# Patient Record
Sex: Male | Born: 1995 | Race: White | Hispanic: Yes | Marital: Single | Smoking: Current some day smoker
Health system: Southern US, Community
[De-identification: ages and names within clinical notes are randomized; demographics above are authoritative.]

## PROBLEM LIST (undated history)

## (undated) HISTORY — PX: EYE SURGERY: SHX253

---

## 2020-05-19 ENCOUNTER — Encounter (HOSPITAL_BASED_OUTPATIENT_CLINIC_OR_DEPARTMENT_OTHER): Payer: Self-pay

## 2020-05-19 ENCOUNTER — Other Ambulatory Visit: Payer: Self-pay

## 2020-05-19 ENCOUNTER — Emergency Department (HOSPITAL_BASED_OUTPATIENT_CLINIC_OR_DEPARTMENT_OTHER)
Admission: EM | Admit: 2020-05-19 | Discharge: 2020-05-19 | Disposition: A | Discharge status: Home / Self Care | Payer: Worker's Compensation | Attending: Emergency Medicine | Admitting: Emergency Medicine

## 2020-05-19 ENCOUNTER — Emergency Department (HOSPITAL_BASED_OUTPATIENT_CLINIC_OR_DEPARTMENT_OTHER): Payer: Worker's Compensation

## 2020-05-19 DIAGNOSIS — S0231XA Fracture of orbital floor, right side, initial encounter for closed fracture: Secondary | ICD-10-CM | POA: Diagnosis not present

## 2020-05-19 DIAGNOSIS — R110 Nausea: Secondary | ICD-10-CM | POA: Insufficient documentation

## 2020-05-19 DIAGNOSIS — R519 Headache, unspecified: Secondary | ICD-10-CM | POA: Insufficient documentation

## 2020-05-19 DIAGNOSIS — S022XXA Fracture of nasal bones, initial encounter for closed fracture: Secondary | ICD-10-CM | POA: Insufficient documentation

## 2020-05-19 DIAGNOSIS — Y990 Civilian activity done for income or pay: Secondary | ICD-10-CM | POA: Diagnosis not present

## 2020-05-19 DIAGNOSIS — R42 Dizziness and giddiness: Secondary | ICD-10-CM | POA: Diagnosis not present

## 2020-05-19 DIAGNOSIS — Z23 Encounter for immunization: Secondary | ICD-10-CM | POA: Diagnosis not present

## 2020-05-19 DIAGNOSIS — W228XXA Striking against or struck by other objects, initial encounter: Secondary | ICD-10-CM | POA: Insufficient documentation

## 2020-05-19 DIAGNOSIS — F17200 Nicotine dependence, unspecified, uncomplicated: Secondary | ICD-10-CM | POA: Diagnosis not present

## 2020-05-19 DIAGNOSIS — S01111A Laceration without foreign body of right eyelid and periocular area, initial encounter: Secondary | ICD-10-CM | POA: Insufficient documentation

## 2020-05-19 DIAGNOSIS — S0181XA Laceration without foreign body of other part of head, initial encounter: Secondary | ICD-10-CM

## 2020-05-19 DIAGNOSIS — S0992XA Unspecified injury of nose, initial encounter: Secondary | ICD-10-CM | POA: Diagnosis present

## 2020-05-19 MED ORDER — LIDOCAINE HCL (PF) 1 % IJ SOLN
5.0000 mL | Freq: Once | INTRAMUSCULAR | Status: AC
  Administered 2020-05-19: 5 mL
  Filled 2020-05-19: qty 5

## 2020-05-19 MED ORDER — AMOXICILLIN-POT CLAVULANATE 875-125 MG PO TABS: 1.0000 | ORAL_TABLET | Freq: Two times a day (BID) | ORAL | 0 refills | Status: AC

## 2020-05-19 MED ORDER — MORPHINE SULFATE (PF) 4 MG/ML IV SOLN
4.0000 mg | Freq: Once | INTRAVENOUS | Status: AC
  Administered 2020-05-19: 4 mg via INTRAVENOUS
  Filled 2020-05-19: qty 1

## 2020-05-19 MED ORDER — ONDANSETRON 4 MG PO TBDP: 4.0000 mg | ORAL_TABLET | Freq: Once | ORAL | Status: DC

## 2020-05-19 MED ORDER — ONDANSETRON HCL 4 MG/2ML IJ SOLN
4.0000 mg | Freq: Once | INTRAMUSCULAR | Status: AC
  Administered 2020-05-19: 4 mg via INTRAVENOUS
  Filled 2020-05-19: qty 2

## 2020-05-19 MED ORDER — NAPROXEN 500 MG PO TABS: 500.0000 mg | ORAL_TABLET | Freq: Two times a day (BID) | ORAL | 0 refills | Status: AC

## 2020-05-19 MED ORDER — ACETAMINOPHEN 500 MG PO TABS: 1000.0000 mg | ORAL_TABLET | Freq: Once | ORAL | Status: DC

## 2020-05-19 MED ORDER — TETANUS-DIPHTH-ACELL PERTUSSIS 5-2.5-18.5 LF-MCG/0.5 IM SUSY
0.5000 mL | PREFILLED_SYRINGE | Freq: Once | INTRAMUSCULAR | Status: AC
  Administered 2020-05-19: 0.5 mL via INTRAMUSCULAR
  Filled 2020-05-19: qty 0.5

## 2020-05-19 NOTE — ED Provider Notes (Signed)
[facility] EMERGENCY DEPARTMENT Provider Note   CSN: [id number] Arrival date & time: 05/19/20  0950     History Chief Complaint  Patient presents with  . Facial Laceration    Jesus Middleton is a 25 y.o. male.  25 y.o male with no PMH presents to the ED s/p facial trauma while at work.  Patient was at work this morning when somebody was cutting wood with a saw when a piece of wood flew into the right eye.  States there is pain to the area, pain with reaching of his right eye, he is unable to see from the right eye due to the increased swelling through the periorbital region.  He has not taken anything for pain.  He also endorses a headache, mostly on the frontal aspect of his head, worsened with movement along with rotation of his head.  No alleviating factors, has not taken any medication for improvement in symptoms.  Last tetanus immunization is unknown.  He also endorses nausea and feeling lightheaded.  No other injury, no neck pain, no weakness.  The history is provided by the patient and medical records.       History reviewed. No pertinent past medical history.  There are no problems to display for this patient.   History reviewed. No pertinent surgical history.     History reviewed. No pertinent family history.  Social History   Tobacco Use  . Smoking status: Current Some Day Smoker  . Smokeless tobacco: Never Used  Vaping Use  . Vaping Use: Never used  Substance Use Topics  . Alcohol use: Yes  . Drug use: Never    Home Medications Prior to Admission medications   Medication Sig Start Date End Date Taking? Authorizing Provider  amoxicillin-clavulanate (AUGMENTIN) 875-125 MG tablet Take 1 tablet by mouth 2 (two) times daily for 7 days. 05/19/20 05/26/20 Yes Aribelle Mccosh, PA-C  naproxen (NAPROSYN) 500 MG tablet Take 1 tablet (500 mg total) by mouth 2 (two) times daily for 7 days. 05/19/20 05/26/20 Yes Claude MangesSoto, Pascha Fogal, PA-C    Allergies    Patient has no known  allergies.  Review of Systems   Review of Systems  Constitutional: Negative for diaphoresis.  Eyes: Positive for pain.  Respiratory: Negative for shortness of breath.   Cardiovascular: Negative for chest pain.  Skin: Positive for wound.  Neurological: Positive for dizziness, light-headedness and headaches.    Physical Exam Updated Vital Signs BP 139/80   Pulse (!) 55   Temp 97.8 F (36.6 C)   Resp 18   Ht 6' (1.829 m)   Wt 97 kg   SpO2 100%   BMI 29.00 kg/m   Physical Exam Vitals and nursing note reviewed.  Constitutional:      Appearance: Normal appearance.  HENT:     Head: Normocephalic.     Comments: 3 lacerations noted to the right eye, involving the right eyebrow.     Nose: Nose normal.     Mouth/Throat:     Mouth: Mucous membranes are moist.  Eyes:     Extraocular Movements: Extraocular movements intact.     Right eye: Normal extraocular motion.     Left eye: Normal extraocular motion.     Conjunctiva/sclera:     Right eye: Right conjunctiva is injected. No chemosis, exudate or hemorrhage.    Left eye: Left conjunctiva is not injected. No chemosis, exudate or hemorrhage.    Pupils: Pupils are equal, round, and reactive to light.  Comments: Pupils are pinpoint and reactive. Please see photos attached.  No decrease EOM, no diplopia, no signs of entrapment.  Neck:     Comments: No cervical tenderness, no pain with ranging of the neck.  Cardiovascular:     Rate and Rhythm: Normal rate.  Pulmonary:     Effort: Pulmonary effort is normal.     Breath sounds: No wheezing.  Abdominal:     General: Abdomen is flat.  Musculoskeletal:     Cervical back: Normal range of motion and neck supple.  Neurological:     Mental Status: He is alert and oriented to person, place, and time.     ED Results / Procedures / Treatments   Labs (all labs ordered are listed, but only abnormal results are displayed) Labs Reviewed - No data to  display        EKG None  Radiology CT Head Wo Contrast  Result Date: 05/19/2020 CLINICAL DATA:  25 year old male with injury about the right orbit from saw, piece of wood. Swelling and bleeding. Headache and loss of vision. EXAM: CT HEAD WITHOUT CONTRAST CT MAXILLOFACIAL WITHOUT CONTRAST TECHNIQUE: Multidetector CT imaging of the head and maxillofacial structures were performed using the standard protocol without intravenous contrast. Multiplanar CT image reconstructions of the maxillofacial structures were also generated. COMPARISON:  None. FINDINGS: CT HEAD FINDINGS Brain: No midline shift, ventriculomegaly, mass effect, evidence of mass lesion, intracranial hemorrhage or evidence of cortically based acute infarction. Gray-white matter differentiation is within normal limits throughout the brain. No pneumocephalus. Vascular: No suspicious intracranial vascular hyperdensity. Skull: No skull fracture identified.  Facial bones described below. Other: Orbit findings are below. Visualized scalp soft tissues are within normal limits. CT MAXILLOFACIAL FINDINGS Osseous: Mandible intact and normally located. No acute dental finding. Comminution of the anterior wall of the right maxillary sinus and right orbital floor, detailed next. There is an associated minimally displaced right nasal bone fracture. Contralateral left maxilla intact. Maxillary alveolus intact bilaterally. No zygoma fracture. Pterygoid plates intact. Central skull base and visible cervical vertebrae also appear intact. Orbits: Comminuted right orbital floor fracture with herniated intraorbital fat (coronal image 46). Associated mildly displaced right lamina papyracea fracture (series 3, image 62. Superior and lateral walls of the right orbit remain intact. Associated comminution of the anterior wall of the right maxillary sinus, and mildly displaced fracture through the right nasal process of the maxilla (series 3, image 45). Nondisplaced  fracture through the floor of the right maxillary sinus also on series 3, image 42. And similar nondisplaced posterior wall fracture best seen on image 52. Small volume hemorrhage layering within the right maxillary sinus. Trace right retro maxillary gas. In addition to the herniated fat there is a moderate volume of right intraorbital gas, and mild intraorbital contusion. Right globe appears intact. No herniated extraocular muscle. No discrete intraorbital hematoma. No exophthalmos. Contralateral left orbital walls intact, left orbits soft tissues appear normal. Sinuses: Right maxillary sinus detailed above. Right ethmoids remain relatively aerated despite the lamina papyracea fracture. Other paranasal sinuses are clear. Rightward deviation of the nasal septum could be chronic. Tympanic cavities and visible mastoids are clear. Soft tissues: Additional subcutaneous gas in the right premalar space. Negative visible noncontrast deep soft tissue spaces of the neck. Left face soft tissues appear normal. IMPRESSION: 1. Fractures of the right orbital floor, right lamina papyracea, comminution of the anterior right maxillary sinus with mildly displaced nasal process fracture, and nondisplaced right nasal bone, right maxillary floor and posterior wall  fractures. 2. Moderate associated right side intraorbital gas. Mild intraorbital contusion. Some intraorbital fat herniated through the orbital floor and lamina papyracea fractures. 3. Small volume hemorrhage layering within the right maxillary sinus. 4. Normal noncontrast CT appearance of the brain. No other acute traumatic injury identified. Electronically Signed   By: Odessa Fleming M.D.   On: 05/19/2020 11:17   CT Maxillofacial Wo Contrast  Result Date: 05/19/2020 CLINICAL DATA:  25 year old male with injury about the right orbit from saw, piece of wood. Swelling and bleeding. Headache and loss of vision. EXAM: CT HEAD WITHOUT CONTRAST CT MAXILLOFACIAL WITHOUT CONTRAST  TECHNIQUE: Multidetector CT imaging of the head and maxillofacial structures were performed using the standard protocol without intravenous contrast. Multiplanar CT image reconstructions of the maxillofacial structures were also generated. COMPARISON:  None. FINDINGS: CT HEAD FINDINGS Brain: No midline shift, ventriculomegaly, mass effect, evidence of mass lesion, intracranial hemorrhage or evidence of cortically based acute infarction. Gray-white matter differentiation is within normal limits throughout the brain. No pneumocephalus. Vascular: No suspicious intracranial vascular hyperdensity. Skull: No skull fracture identified.  Facial bones described below. Other: Orbit findings are below. Visualized scalp soft tissues are within normal limits. CT MAXILLOFACIAL FINDINGS Osseous: Mandible intact and normally located. No acute dental finding. Comminution of the anterior wall of the right maxillary sinus and right orbital floor, detailed next. There is an associated minimally displaced right nasal bone fracture. Contralateral left maxilla intact. Maxillary alveolus intact bilaterally. No zygoma fracture. Pterygoid plates intact. Central skull base and visible cervical vertebrae also appear intact. Orbits: Comminuted right orbital floor fracture with herniated intraorbital fat (coronal image 46). Associated mildly displaced right lamina papyracea fracture (series 3, image 62. Superior and lateral walls of the right orbit remain intact. Associated comminution of the anterior wall of the right maxillary sinus, and mildly displaced fracture through the right nasal process of the maxilla (series 3, image 45). Nondisplaced fracture through the floor of the right maxillary sinus also on series 3, image 42. And similar nondisplaced posterior wall fracture best seen on image 52. Small volume hemorrhage layering within the right maxillary sinus. Trace right retro maxillary gas. In addition to the herniated fat there is a  moderate volume of right intraorbital gas, and mild intraorbital contusion. Right globe appears intact. No herniated extraocular muscle. No discrete intraorbital hematoma. No exophthalmos. Contralateral left orbital walls intact, left orbits soft tissues appear normal. Sinuses: Right maxillary sinus detailed above. Right ethmoids remain relatively aerated despite the lamina papyracea fracture. Other paranasal sinuses are clear. Rightward deviation of the nasal septum could be chronic. Tympanic cavities and visible mastoids are clear. Soft tissues: Additional subcutaneous gas in the right premalar space. Negative visible noncontrast deep soft tissue spaces of the neck. Left face soft tissues appear normal. IMPRESSION: 1. Fractures of the right orbital floor, right lamina papyracea, comminution of the anterior right maxillary sinus with mildly displaced nasal process fracture, and nondisplaced right nasal bone, right maxillary floor and posterior wall fractures. 2. Moderate associated right side intraorbital gas. Mild intraorbital contusion. Some intraorbital fat herniated through the orbital floor and lamina papyracea fractures. 3. Small volume hemorrhage layering within the right maxillary sinus. 4. Normal noncontrast CT appearance of the brain. No other acute traumatic injury identified. Electronically Signed   By: Odessa Fleming M.D.   On: 05/19/2020 11:17    Procedures .Marland KitchenLaceration Repair  Date/Time: 05/19/2020 12:16 PM Performed by: Claude Manges, PA-C Authorized by: Claude Manges, PA-C   Consent:    Consent  obtained:  Verbal   Consent given by:  Patient   Risks discussed:  Infection, pain, poor cosmetic result and poor wound healing Universal protocol:    Patient identity confirmed:  Verbally with patient Anesthesia:    Anesthesia method:  Local infiltration   Local anesthetic:  Lidocaine 1% w/o epi Laceration details:    Location:  Face   Face location:  R upper eyelid   Extent:  Superficial    Length (cm):  2   Depth (mm):  1 Exploration:    Hemostasis achieved with:  Direct pressure Treatment:    Area cleansed with:  Saline   Amount of cleaning:  Extensive   Irrigation solution:  Sterile saline Skin repair:    Repair method:  Sutures   Suture size:  6-0   Suture material:  Prolene   Suture technique:  Simple interrupted   Number of sutures:  5 Approximation:    Approximation:  Close Repair type:    Repair type:  Simple Post-procedure details:    Dressing:  Open (no dressing)   Procedure completion:  Tolerated well, no immediate complications     Medications Ordered in ED Medications  lidocaine (PF) (XYLOCAINE) 1 % injection 5 mL (has no administration in time range)  Tdap (BOOSTRIX) injection 0.5 mL (0.5 mLs Intramuscular Given 05/19/20 1105)  morphine 4 MG/ML injection 4 mg (4 mg Intravenous Given 05/19/20 1101)  ondansetron (ZOFRAN) injection 4 mg (4 mg Intravenous Given 05/19/20 1101)    ED Course  I have reviewed the triage vital signs and the nursing notes.  Pertinent labs & imaging results that were available during my care of the patient were reviewed by me and considered in my medical decision making (see chart for details).    MDM Rules/Calculators/A&P   Patient with no pertinent past medical history presents to the ED with a chief complaint of facial trauma while at work.  States that a flying piece of wood struck him in the right eye, causing 3 lacerations, 1 above his right nasal region, the second 1 involving the lower eyelid in the third 1 above the right eyelid.  He is reporting a headache, dizziness, lightheadedness and pain with movement of his right eye, however extraocular movements are slow but intact.  Lungs are clear to auscultation, he denies any other injury abdomen is soft nontender to palpation.  Last tetanus immunization is unknown, will update this on today's visit.  Endorses a headache along the frontal region, as object was likely at a  high speed, will obtain facial CT to rule out any facial fractures.  Provided with morphine for pain along with Zofran for nausea.  CT Head/Facial:  1. Fractures of the right orbital floor, right lamina papyracea,  comminution of the anterior right maxillary sinus with mildly  displaced nasal process fracture, and nondisplaced right nasal bone,  right maxillary floor and posterior wall fractures.    2. Moderate associated right side intraorbital gas. Mild  intraorbital contusion. Some intraorbital fat herniated through the  orbital floor and lamina papyracea fractures.    3. Small volume hemorrhage layering within the right maxillary  sinus.    4. Normal noncontrast CT appearance of the brain. No other acute  traumatic injury identified.   Wound visualized with my attending Dr. Lynelle Doctor, will call maxillofacial Dr. Elijah Birk for further recommendations.   11:38 AM Spoke to Dr. Elijah Birk, who recommended ophthalmology consult.  11:46 AM spoke to Dr. Genia Del of ophthalmology who recommended repair along with  placing patient on a short course of antibiotics.  He should follow-up with them within 1 week.  I personally repaired patient's wound, 5 stitches were applied to the right upper eyelid, he tolerated the procedure well.   Portions of this note were generated with Scientist, clinical (histocompatibility and immunogenetics). Dictation errors may occur despite best attempts at proofreading.  Final Clinical Impression(s) / ED Diagnoses Final diagnoses:  Facial laceration, initial encounter  Closed fracture of right orbital floor, initial encounter [facility])  Closed fracture of nasal bone, initial encounter    Rx / DC Orders ED Discharge Orders         Ordered    amoxicillin-clavulanate (AUGMENTIN) 875-125 MG tablet  2 times daily        05/19/20 1143    naproxen (NAPROSYN) 500 MG tablet  2 times daily        05/19/20 1218           Claude Manges, PA-C 05/19/20 1221    Linwood Dibbles, MD 05/20/20 [phone]

## 2020-05-19 NOTE — ED Triage Notes (Signed)
Pt was at work when a piece of wood someone was cutting with a saw flew up and hit in above the right eye. Swelling and laceration noted. States he cant see out of his right eye because of the swelling. C/o headache, denies LOC.  Unsure of last tetanus

## 2020-05-19 NOTE — ED Notes (Signed)
Pt very sleepy from meds states head hurts, up to Br with assist, has called his mom to pick him up, cannot see out of  Rt eye

## 2020-05-19 NOTE — Discharge Instructions (Addendum)
I have placed 5 sutures to your right eyelid, please have these removed within 5 to 7 days.  Keep your wound away from the sun, lean and dry.  You may apply bacitracin ointment.  I have also prescribed antibiotics, please take 1 tablet twice a day for the next 7 days to avoid infection.  You were also given anti-inflammatories to help with swelling, please take 1 tablet twice a day for the next 7 days.  For your nasal and facial fractures you will need to avoid blowing your nose, sleeping sitting up to avoid swelling.  You may apply ice to the right eye to help with swelling.  You were given the number to Dr. Genia Del, ophthalmologist please follow-up with him for your orbital fractures.

## 2022-09-24 IMAGING — CT CT MAXILLOFACIAL W/O CM
3 series · 14 of 47 positions shown, 16 images · non-contrast
Comparison: None.

CLINICAL DATA: 24-year-old male with injury about the right orbit
from saw, piece of [place]. Swelling and bleeding. Headache and loss of
vision.

EXAM:
CT HEAD WITHOUT CONTRAST
CT MAXILLOFACIAL WITHOUT CONTRAST
TECHNIQUE: Multidetector CT imaging of the head and maxillofacial structures
were performed using the standard protocol without intravenous
contrast. Multiplanar CT image reconstructions of the maxillofacial
structures were also generated.

[Series 2: max soft · axial · 0.35mm/px · z∈[-299,-175]mm · 8 of 72 slices shown, 10 images]
[im 5/72  brain]
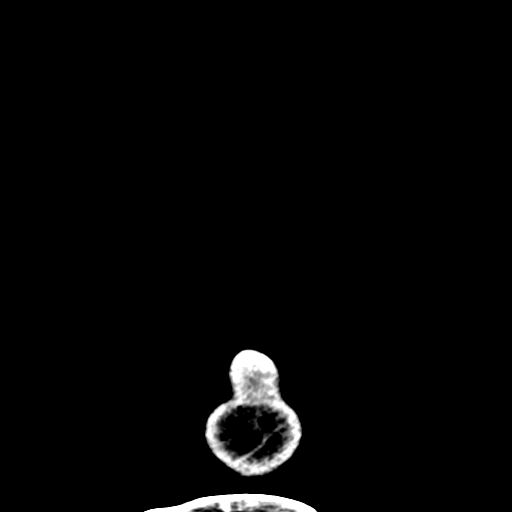
[im 5/72  bone]
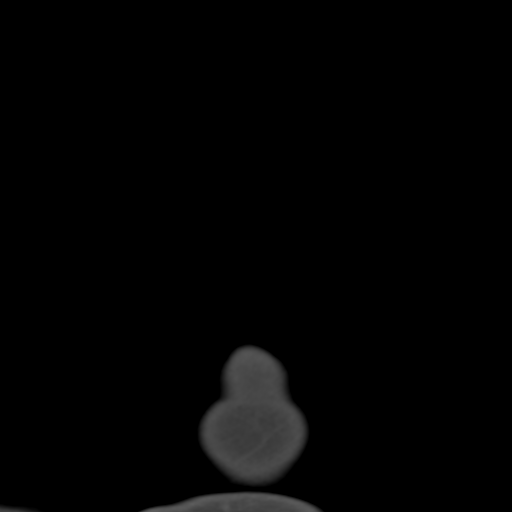
[im 15/72  bone]
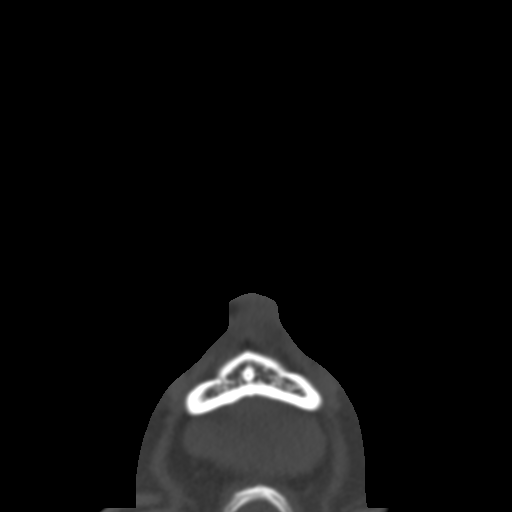
[im 23/72  bone]
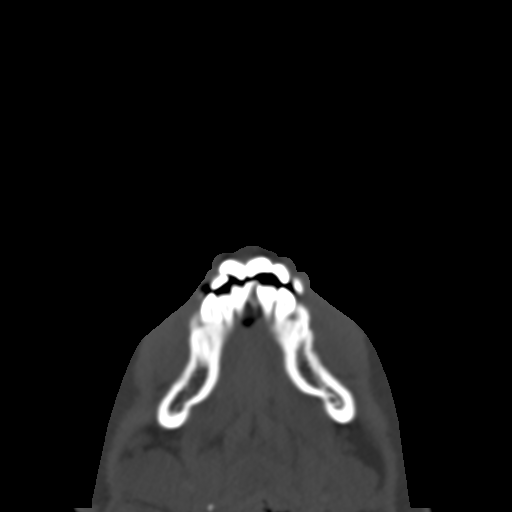
[im 32/72  bone]
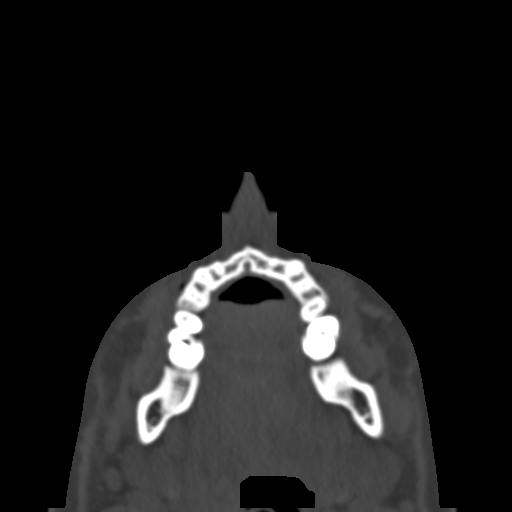
[im 40/72  brain]
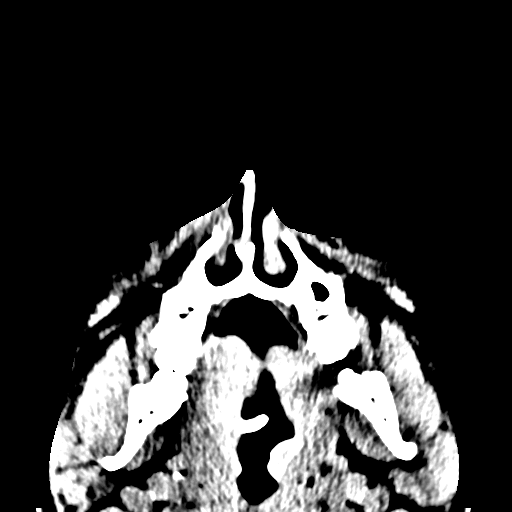
[im 40/72  bone]
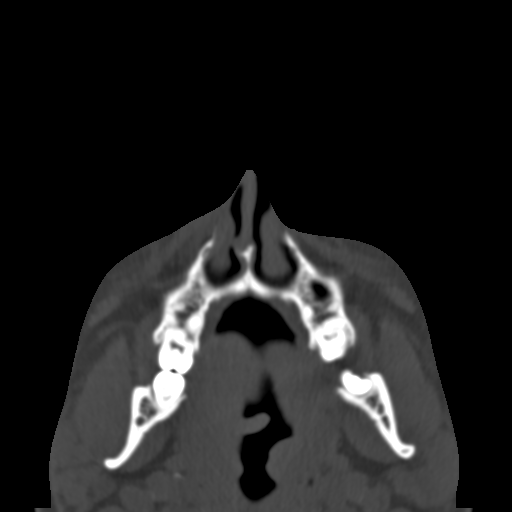
[im 49/72  bone]
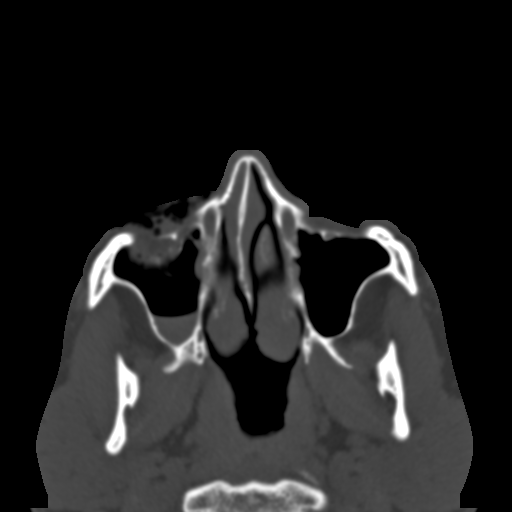
[im 57/72  bone]
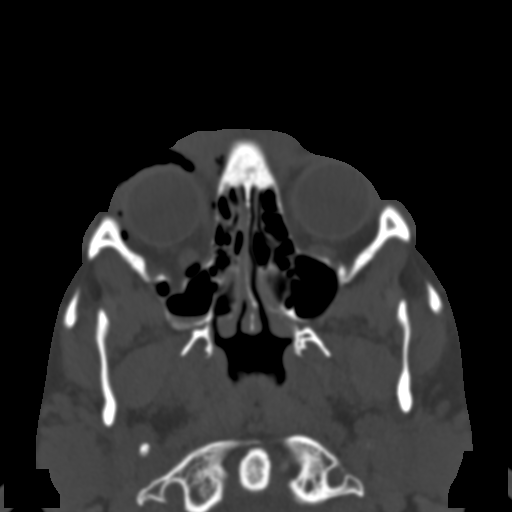
[im 67/72  bone]
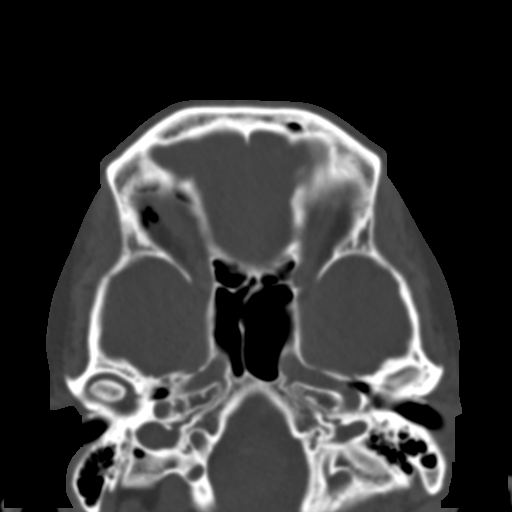

[Series 6: coronal soft · coronal · 0.36mm/px · 3 of 74 slices shown]
[im 25/74  bone]
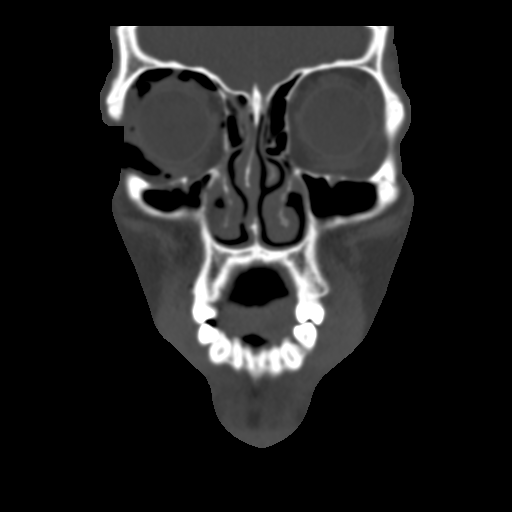
[im 33/74  bone]
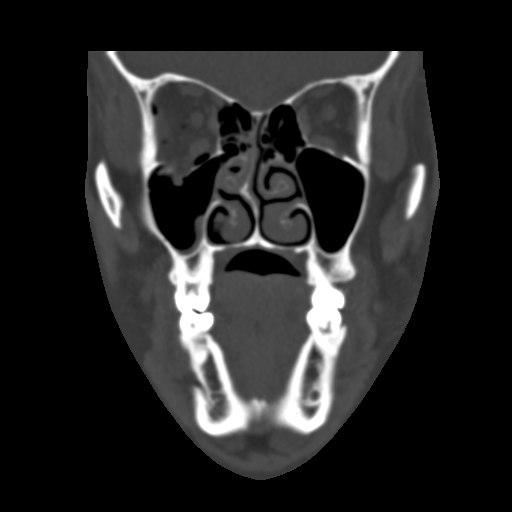
[im 41/74  bone]
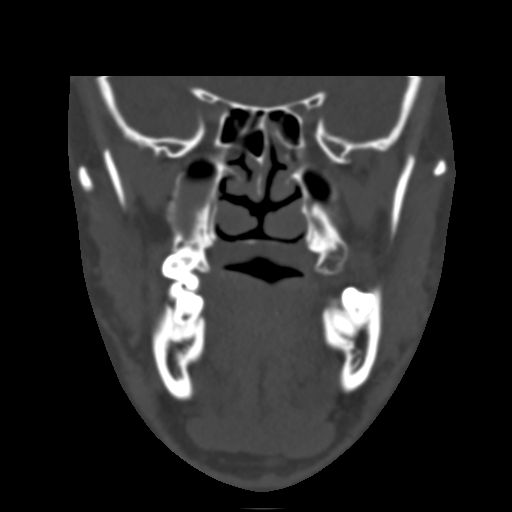

[Series 7: sagittal soft · sagittal · 0.28mm/px · 3 of 79 slices shown]
[im 27/79  bone]
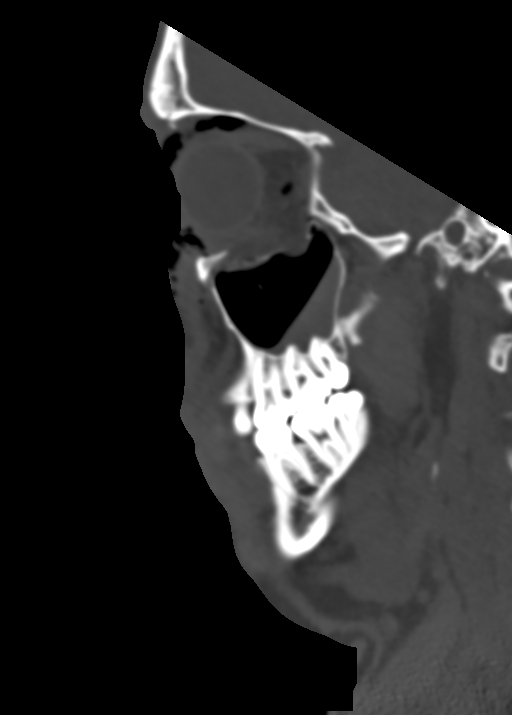
[im 40/79  bone]
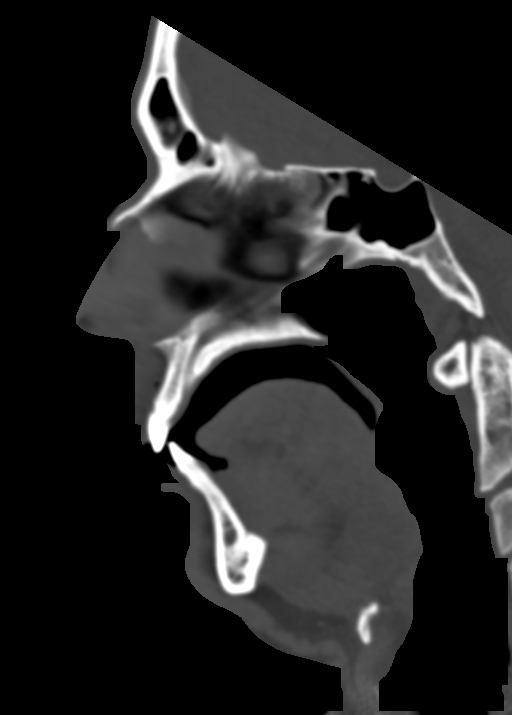
[im 53/79  bone]
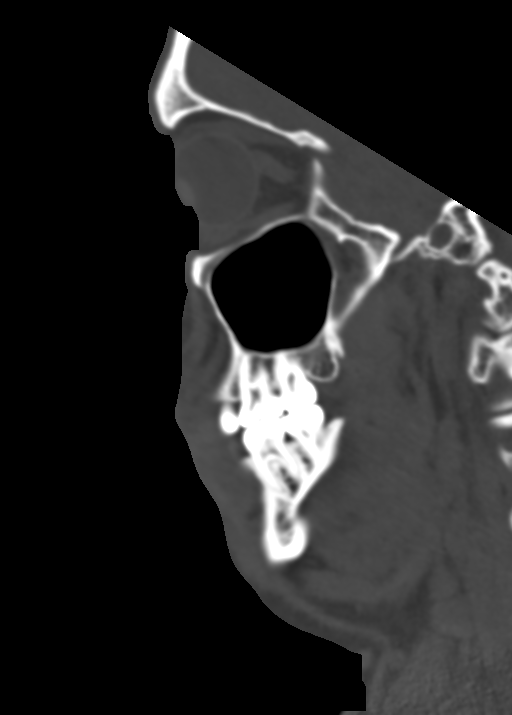

[14 of 47 positions shown; findings below may reference images not displayed]

FINDINGS: CT HEAD FINDINGS

Brain: No midline shift, ventriculomegaly, mass effect, evidence of
mass lesion, intracranial hemorrhage or evidence of cortically based
acute infarction. Gray-white matter differentiation is within normal
limits throughout the brain. No pneumocephalus.

Vascular: No suspicious intracranial vascular hyperdensity.

Skull: No skull fracture identified.  Facial bones described below.

Other: Orbit findings are below. Visualized scalp soft tissues are
within normal limits.

CT MAXILLOFACIAL FINDINGS

Osseous: Mandible intact and normally located. No acute dental
finding. Comminution of the anterior wall of the right maxillary
sinus and right orbital floor, detailed next. There is an associated
minimally displaced right nasal bone fracture.

Contralateral left maxilla intact. Maxillary alveolus intact
bilaterally. No zygoma fracture. Pterygoid plates intact. Central
skull base and visible cervical vertebrae also appear intact.

Orbits: Comminuted right orbital floor fracture with herniated
intraorbital fat (coronal image 46). Associated mildly displaced
right lamina papyracea fracture (series 3, image 62. Superior and
lateral walls of the right orbit remain intact.

Associated comminution of the anterior wall of the right maxillary
sinus, and mildly displaced fracture through the right nasal process
of the maxilla (series 3, image 45). Nondisplaced fracture through
the floor of the right maxillary sinus also on series 3, image 42.
And similar nondisplaced posterior wall fracture best seen on image
52.

Small volume hemorrhage layering within the right maxillary sinus.
Trace right retro maxillary gas.

In addition to the herniated fat there is a moderate volume of right
intraorbital gas, and mild intraorbital contusion. Right globe
appears intact. No herniated extraocular muscle. No discrete
intraorbital hematoma. No exophthalmos.

Contralateral left orbital walls intact, left orbits soft tissues
appear normal.

Sinuses: Right maxillary sinus detailed above. Right ethmoids remain
relatively aerated despite the lamina papyracea fracture. Other
paranasal sinuses are clear. Rightward deviation of the nasal septum
could be chronic. Tympanic cavities and visible mastoids are clear.

Soft tissues: Additional subcutaneous gas in the right premalar
space. Negative visible noncontrast deep soft tissue spaces of the
neck. Left face soft tissues appear normal.
IMPRESSION: 1. Fractures of the right orbital floor, right lamina papyracea,
comminution of the anterior right maxillary sinus with mildly
displaced nasal process fracture, and nondisplaced right nasal bone,
right maxillary floor and posterior wall fractures.

2. Moderate associated right side intraorbital gas. Mild
intraorbital contusion. Some intraorbital fat herniated through the
orbital floor and lamina papyracea fractures.

3. Small volume hemorrhage layering within the right maxillary
sinus.

4. Normal noncontrast CT appearance of the brain. No other acute
traumatic injury identified.

## 2023-08-10 ENCOUNTER — Other Ambulatory Visit (HOSPITAL_BASED_OUTPATIENT_CLINIC_OR_DEPARTMENT_OTHER): Payer: Self-pay

## 2023-08-10 ENCOUNTER — Ambulatory Visit (HOSPITAL_BASED_OUTPATIENT_CLINIC_OR_DEPARTMENT_OTHER)
Admission: EM | Admit: 2023-08-10 | Discharge: 2023-08-10 | Disposition: A | Discharge status: Home / Self Care | Payer: Self-pay | Attending: Family Medicine | Admitting: Family Medicine

## 2023-08-10 ENCOUNTER — Encounter (HOSPITAL_BASED_OUTPATIENT_CLINIC_OR_DEPARTMENT_OTHER): Payer: Self-pay | Admitting: Emergency Medicine

## 2023-08-10 DIAGNOSIS — R220 Localized swelling, mass and lump, head: Secondary | ICD-10-CM | POA: Insufficient documentation

## 2023-08-10 DIAGNOSIS — T7840XA Allergy, unspecified, initial encounter: Secondary | ICD-10-CM | POA: Insufficient documentation

## 2023-08-10 DIAGNOSIS — J039 Acute tonsillitis, unspecified: Secondary | ICD-10-CM | POA: Insufficient documentation

## 2023-08-10 DIAGNOSIS — R21 Rash and other nonspecific skin eruption: Secondary | ICD-10-CM | POA: Insufficient documentation

## 2023-08-10 DIAGNOSIS — X58XXXA Exposure to other specified factors, initial encounter: Secondary | ICD-10-CM | POA: Insufficient documentation

## 2023-08-10 DIAGNOSIS — H5789 Other specified disorders of eye and adnexa: Secondary | ICD-10-CM

## 2023-08-10 LAB — POCT RAPID STREP A (OFFICE): Rapid Strep A Screen: NEGATIVE

## 2023-08-10 MED ORDER — TRIAMCINOLONE ACETONIDE 40 MG/ML IJ SUSP
80.0000 mg | Freq: Once | INTRAMUSCULAR | Status: AC
  Administered 2023-08-10: 80 mg via INTRAMUSCULAR

## 2023-08-10 MED ORDER — EPINEPHRINE 0.3 MG/0.3ML IJ SOAJ: 0.3000 mg | INTRAMUSCULAR | 2 refills | Status: AC | PRN

## 2023-08-10 MED ORDER — AMOXICILLIN 875 MG PO TABS
875.0000 mg | ORAL_TABLET | Freq: Two times a day (BID) | ORAL | 0 refills | Status: AC
  Filled 2023-08-10: qty 14, 7d supply, fill #0

## 2023-08-10 MED ORDER — CETIRIZINE HCL 10 MG PO TABS
10.0000 mg | ORAL_TABLET | Freq: Every day | ORAL | 0 refills | Status: AC | PRN
  Filled 2023-08-10: qty 30, 30d supply, fill #0

## 2023-08-10 MED ORDER — EPINEPHRINE 0.3 MG/0.3ML IJ SOAJ
0.3000 mg | INTRAMUSCULAR | 2 refills | Status: DC | PRN
  Filled 2023-08-10: qty 2, 30d supply, fill #0

## 2023-08-10 NOTE — ED Triage Notes (Addendum)
 Pt c/o rash started last Thursday on his arms now the rash is all over his body, pt reports his eye started swelling 3 days ago. Pt has taken Benadryl by mouth and used benadryl creams. Pt recently started a new job and he thinks he may be allergic to a chemical they use at work

## 2023-08-10 NOTE — Discharge Instructions (Addendum)
 Rash with swelling and itching versus scarlet fever from strep throat: Rapid strep is negative.  Throat culture sent.  Will start on amoxicillin , 875 mg 1 pill twice daily for 7 days.  If throat culture is negative we will call and stop the amoxicillin .  Kenalog 80 mg injection now (this is a steroid injection).  Cetirizine 10 mg daily for rash and itching.  If itching is significant may take cetirizine 10 mg twice daily.  Be cautious cetirizine should not be sedating like Benadryl but sometimes it does cause some sedation.  Mostly take the cetirizine at night, prior to sleep.  Provided an EpiPen.  Use if needed for worsening swelling, shortness of breath or difficulty breathing.  Work excuse provided.  Follow-up if symptoms do not improve, worsen or new symptoms occur.

## 2023-08-10 NOTE — ED Provider Notes (Signed)
 [facility]    CSN: [id number] Arrival date & time: 08/10/23  9171      History   Chief Complaint No chief complaint on file.   HPI Jesus Middleton is a 28 y.o. male.   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  On Thursday, 08/02/2023, he started developing a small rash on his upper body.  It is a red, micro dot, irritated rash.  The rash itches.  On 08/07/2023, he started swelling in his upper body and face.  He is taken Benadryl and used OTC Benadryl creams on the rash.  The rash continues to get worse.  He denies fever, cough, nausea, vomiting, diarrhea, constipation.  He reports that a coworker had a similar rash months ago when he started with the company and was told that it was all an allergic reaction to the chemicals.     History reviewed. No pertinent past medical history.  There are no active problems to display for this patient.   Past Surgical History:  Procedure Laterality Date   EYE SURGERY Right        Home Medications    Prior to Admission medications   Medication Sig Start Date End Date Taking? Authorizing Provider  amoxicillin  (AMOXIL ) 875 MG tablet Take 1 tablet (875 mg total) by mouth 2 (two) times daily for 7 days. 08/10/23 08/17/23 Yes Ival Domino, FNP  cetirizine (ZYRTEC) 10 MG tablet Take 1 tablet (10 mg total) by mouth daily as needed for allergies (allergies). 08/10/23 09/09/23 Yes Ival Domino, FNP  EPINEPHrine (EPIPEN 2-PAK) 0.3 mg/0.3 mL IJ SOAJ injection Inject 0.3 mg into the muscle as needed for anaphylaxis. 08/10/23   Ival Domino, FNP    Family History History reviewed. No pertinent family history.  Social History Social History   Tobacco Use   Smoking status: Never   Smokeless tobacco: Never  Vaping Use   Vaping status: Every Day  Substance Use Topics   Alcohol use: Not Currently    Comment: occasional   Drug use: Never     Allergies   Venlafaxine   Review  of Systems Review of Systems  Constitutional:  Negative for fever.  Respiratory:  Negative for cough.   Cardiovascular:  Negative for chest pain.  Gastrointestinal:  Negative for abdominal pain, constipation, diarrhea, nausea and vomiting.  Musculoskeletal:  Negative for arthralgias and back pain.  Skin:  Positive for rash. Negative for color change.  Neurological:  Negative for syncope.  All other systems reviewed and are negative.    Physical Exam Triage Vital Signs ED Triage Vitals  Encounter Vitals Group     BP 08/10/23 0838 128/81     Girls Systolic BP Percentile --      Girls Diastolic BP Percentile --      Boys Systolic BP Percentile --      Boys Diastolic BP Percentile --      Pulse Rate 08/10/23 0838 61     Resp 08/10/23 0838 18     Temp 08/10/23 0838 98.3 F (36.8 C)     Temp Source 08/10/23 0838 Oral     SpO2 08/10/23 0838 97 %     Weight --      Height --      Head Circumference --      Peak Flow --      Pain Score 08/10/23 0836 0     Pain Loc --  Pain Education --      Exclude from Growth Chart --    No data found.  Updated Vital Signs BP 128/81 (BP Location: Right Arm)   Pulse 61   Temp 98.3 F (36.8 C) (Oral)   Resp 18   SpO2 97%   Visual Acuity Right Eye Distance:   Left Eye Distance:   Bilateral Distance:    Right Eye Near:   Left Eye Near:    Bilateral Near:     Physical Exam Vitals and nursing note reviewed.  Constitutional:      General: He is not in acute distress.    Appearance: He is well-developed. He is not ill-appearing or toxic-appearing.  HENT:     Head: Normocephalic and atraumatic.     Right Ear: Hearing and tympanic membrane normal. Swelling (Outer ear and ear canal are erythematous and swollen with a fine macular rash) present.     Left Ear: Hearing and tympanic membrane normal. Swelling (Outer ear and ear canal are erythematous and swollen with a fine macular rash) present.     Nose: No congestion or rhinorrhea.      Right Sinus: No maxillary sinus tenderness or frontal sinus tenderness.     Left Sinus: No maxillary sinus tenderness or frontal sinus tenderness.     Mouth/Throat:     Lips: Pink.     Mouth: Mucous membranes are moist.     Tongue: Tongue does not deviate from midline.     Pharynx: Uvula midline. Posterior oropharyngeal erythema present. No pharyngeal swelling, oropharyngeal exudate or uvula swelling.     Tonsils: No tonsillar exudate (Tonsils are slightly enlarged and erythematous but no exudate.).   Eyes:     Conjunctiva/sclera: Conjunctivae normal.     Pupils: Pupils are equal, round, and reactive to light.     Comments: Eyes are clear bilaterally but severe swelling of right upper and lower eyelids such that the eyelids are swollen shut.  Left eye has some swelling of the upper lid.  The face is covered in an erythematous, fine macular rash with secondary facial swelling noted more on the right side than the left.  Neck:     Trachea: Trachea normal.     Comments: Neck (anterior and posterior) has erythematous, fine macular rash with swelling of the soft tissue. Cardiovascular:     Rate and Rhythm: Normal rate and regular rhythm.     Heart sounds: S1 normal and S2 normal. No murmur heard. Pulmonary:     Effort: Pulmonary effort is normal. No respiratory distress.     Breath sounds: Normal breath sounds. No decreased breath sounds, wheezing, rhonchi or rales.  Abdominal:     General: Bowel sounds are normal.     Palpations: Abdomen is soft.     Tenderness: There is no abdominal tenderness.   Musculoskeletal:        General: No swelling.     Cervical back: Neck supple. Edema and erythema present.  Lymphadenopathy:     Head:     Right side of head: Submental, submandibular and tonsillar adenopathy present. No preauricular or posterior auricular adenopathy.     Left side of head: Submental, submandibular and tonsillar adenopathy present. No preauricular or posterior auricular  adenopathy.     Cervical: Cervical adenopathy present.     Right cervical: Superficial cervical adenopathy present.     Left cervical: Superficial cervical adenopathy present.   Skin:    General: Skin is warm and dry.  Capillary Refill: Capillary refill takes less than 2 seconds.     Findings: Rash (See comments for more information) present.     Comments: Fine macular rash on the face with swelling at both eyes and the right eye is swollen shut.  Swelling of the ears and the swelling extends mostly on the right side of the face.  The fine macular rash continues on the neck anterior and posteriorly.  The rash is noted on the trunk and arms.  There is some fine macular rash on the back but it is much less noticeable.  The rash is present on the abdomen and bilateral legs with more of the rash on the thighs anteriorly then on the lower legs or back of the legs.  See photos for more information   Neurological:     Mental Status: He is alert and oriented to person, place, and time.   Psychiatric:        Mood and Affect: Mood normal.                 UC Treatments / Results  Labs (all labs ordered are listed, but only abnormal results are displayed) Labs Reviewed  POCT RAPID STREP A (OFFICE) - Normal  CULTURE, GROUP A STREP [facility])    EKG   Radiology No results found.  Procedures Procedures (including critical care time)  Medications Ordered in UC Medications  triamcinolone acetonide (KENALOG-40) injection 80 mg (80 mg Intramuscular Given 08/10/23 1000)    Initial Impression / Assessment and Plan / UC Course  I have reviewed the triage vital signs and the nursing notes.  Pertinent labs & imaging results that were available during my care of the patient were reviewed by me and considered in my medical decision making (see chart for details).  Plan of Care: Scarlet fever versus acute allergic reaction: Throat and rash really appear to be severe scarlet fever.   Rapid strep was negative.  Throat culture sent.  Risk of not treating scarlet fever is significant.  Will start on amoxicillin , 875 mg twice daily for 7 days.  Get plenty of fluids and rest.  If the throat culture is negative, we will stop the amoxicillin .  Will treat acute allergic reaction also.  Provided instruction on prescription for an EpiPen.  Needs to have the EpiPen if the swelling worsens or he has any shortness of breath or difficulty breathing.  Oxygen saturation is 98% on room air and lungs are clear.  Inhaler not needed.  Kenalog 80 mg IM now.  Cetirizine 10 mg once or twice daily to help with the rash and the itching.  Out of work until Monday.  This is a really serious situation.  It is either bad scarlet fever, for which she needs antibiotics and for which she is contagious.  Or it is a very bad allergic reaction and he needs time to get over it and not be exposed to the potential chemicals that caused it until he is getting better.  Follow-up if symptoms do not improve, if symptoms worsen or new symptoms occur.  Reviewed signs and symptoms of acute allergic reaction, reasons to use the EpiPen, reasons to call 911 or reasons to go to an emergency room.  His wife was present for all of the discussion and education.  I reviewed the plan of care with the patient and/or the patient's guardian.  The patient and/or guardian had time to ask questions and acknowledged that the questions were answered.  I  provided instruction on symptoms or reasons to return here or to go to an ER, if symptoms/condition did not improve, worsened or if new symptoms occurred.  Final Clinical Impressions(s) / UC Diagnoses   Final diagnoses:  Macular rash  Facial swelling  Periorbital swelling  Acute tonsillitis, unspecified etiology  Acute allergic reaction, initial encounter     Discharge Instructions      Rash with swelling and itching versus scarlet fever from strep throat: Rapid strep is negative.   Throat culture sent.  Will start on amoxicillin , 875 mg 1 pill twice daily for 7 days.  If throat culture is negative we will call and stop the amoxicillin .  Kenalog 80 mg injection now (this is a steroid injection).  Cetirizine 10 mg daily for rash and itching.  If itching is significant may take cetirizine 10 mg twice daily.  Be cautious cetirizine should not be sedating like Benadryl but sometimes it does cause some sedation.  Mostly take the cetirizine at night, prior to sleep.  Provided an EpiPen.  Use if needed for worsening swelling, shortness of breath or difficulty breathing.  Work excuse provided.  Follow-up if symptoms do not improve, worsen or new symptoms occur.     ED Prescriptions     Medication Sig Dispense Auth. Provider   EPINEPHrine (EPIPEN 2-PAK) 0.3 mg/0.3 mL IJ SOAJ injection  (Status: Discontinued) Inject 0.3 mg into the muscle as needed for anaphylaxis. 2 each Ival Domino, FNP   cetirizine (ZYRTEC) 10 MG tablet Take 1 tablet (10 mg total) by mouth daily as needed for allergies (allergies). 30 tablet Skylen Spiering, FNP   amoxicillin  (AMOXIL ) 875 MG tablet Take 1 tablet (875 mg total) by mouth 2 (two) times daily for 7 days. 14 tablet Harrol Novello, FNP   EPINEPHrine (EPIPEN 2-PAK) 0.3 mg/0.3 mL IJ SOAJ injection Inject 0.3 mg into the muscle as needed for anaphylaxis. 2 each Ival Domino, FNP      PDMP not reviewed this encounter.   Ival Domino, FNP 08/10/23 1016

## 2023-08-13 ENCOUNTER — Ambulatory Visit (HOSPITAL_COMMUNITY): Payer: Self-pay

## 2023-08-13 LAB — CULTURE, GROUP A STREP (THRC)

## 2023-08-14 NOTE — Progress Notes (Signed)
 Throat culture is negative.  No sign of strep.  Unlikely to be scarlet fever.  Amoxicillin  is not needed.  Unable to speak to the patient to get a condition update.  He was treated for an acute allergic reaction also.  I was able to leave a voicemail message.  I advised him to call or return if he is not completely better within the next 2 to 4 days.  I encouraged him to stop the amoxicillin  as it is not needed and discard any leftover pills as they are an incomplete treatment.  Continuing amoxicillin  when not needed could increase her risk future allergic reactions and also antibiotic related diarrhea.
